# Patient Record
Sex: Male | Born: 1960 | Race: White | Hispanic: No | Marital: Married | Smoking: Never smoker
Health system: Southern US, Community
[De-identification: ages and names within clinical notes are randomized; demographics above are authoritative.]

## PROBLEM LIST (undated history)

## (undated) DIAGNOSIS — E119 Type 2 diabetes mellitus without complications: Secondary | ICD-10-CM

## (undated) DIAGNOSIS — G4730 Sleep apnea, unspecified: Secondary | ICD-10-CM

## (undated) DIAGNOSIS — IMO0001 Reserved for inherently not codable concepts without codable children: Secondary | ICD-10-CM

## (undated) HISTORY — DX: Type 2 diabetes mellitus without complications: E11.9

## (undated) HISTORY — DX: Sleep apnea, unspecified: G47.30

## (undated) HISTORY — PX: TONSILLECTOMY: SUR1361

---

## 2015-03-08 ENCOUNTER — Encounter: Payer: Self-pay | Admitting: Emergency Medicine

## 2015-03-08 ENCOUNTER — Emergency Department
Admission: EM | Admit: 2015-03-08 | Discharge: 2015-03-08 | Discharge status: Against Medical Advice | Payer: 59 | Attending: Emergency Medicine | Admitting: Emergency Medicine

## 2015-03-08 ENCOUNTER — Emergency Department: Payer: 59

## 2015-03-08 DIAGNOSIS — R079 Chest pain, unspecified: Secondary | ICD-10-CM | POA: Diagnosis present

## 2015-03-08 DIAGNOSIS — I200 Unstable angina: Secondary | ICD-10-CM | POA: Diagnosis not present

## 2015-03-08 LAB — BASIC METABOLIC PANEL
Anion gap: 5 (ref 5–15)
BUN: 15 mg/dL (ref 6–20)
CHLORIDE: 105 mmol/L (ref 101–111)
CO2: 26 mmol/L (ref 22–32)
CREATININE: 1.02 mg/dL (ref 0.61–1.24)
Calcium: 8.9 mg/dL (ref 8.9–10.3)
GFR calc Af Amer: >60 mL/min (ref >60)
GFR calc non Af Amer: >60 mL/min (ref >60)
Glucose, Bld: 183 mg/dL — ABNORMAL HIGH (ref 65–99)
POTASSIUM: 3.7 mmol/L (ref 3.5–5.1)
Sodium: 136 mmol/L (ref 135–145)

## 2015-03-08 LAB — CBC
HEMATOCRIT: 41.1 % (ref 40.0–52.0)
Hemoglobin: 13.7 g/dL (ref 13.0–18.0)
MCH: 27.5 pg (ref 26.0–34.0)
MCHC: 33.3 g/dL (ref 32.0–36.0)
MCV: 82.4 fL (ref 80.0–100.0)
PLATELETS: 198 10*3/uL (ref 150–440)
RBC: 4.99 MIL/uL (ref 4.40–5.90)
RDW: 12.9 % (ref 11.5–14.5)
WBC: 7.3 10*3/uL (ref 3.8–10.6)

## 2015-03-08 LAB — TROPONIN I: Troponin I: <0.03 ng/mL (ref <0.031)

## 2015-03-08 LAB — GLUCOSE, CAPILLARY: Glucose-Capillary: 82 mg/dL (ref 65–99)

## 2015-03-08 MED ORDER — ASPIRIN 81 MG PO CHEW
CHEWABLE_TABLET | ORAL | Status: AC
  Administered 2015-03-08: 324 mg via ORAL
  Filled 2015-03-08: qty 4

## 2015-03-08 MED ORDER — ASPIRIN 81 MG PO CHEW
324.0000 mg | CHEWABLE_TABLET | Freq: Once | ORAL | Status: AC
  Administered 2015-03-08: 324 mg via ORAL

## 2015-03-08 NOTE — ED Provider Notes (Addendum)
[facility] Emergency Department Provider Note  ____________________________________________  Time seen: Approximately 6:22 PM  I have reviewed the triage vital signs and the nursing notes.   HISTORY  Chief Complaint Chest Pain and Shortness of Breath    HPI Sharlynn OliphantDavy Kuck is a 55 y.o. male who reports chest pain and shortness of breath with exertion at work this week. Today he developed some chest pain with shortness of breath and left arm feeling funny without any exertion today. His blood pressure was elevated today. He does not usually have anything like this going on. He has not exerted himself today so he cannot say if exertion makes it worse. He's been resting pretty much the whole time. He still having some chest discomfort now although it's very mild at the present time. As some tightness and heaviness.   History reviewed. No pertinent past medical history. Patient denies any past medical history There are no active problems to display for this patient.   No past surgical history on file.  No current outpatient prescriptions on file.  Allergies Review of patient's allergies indicates no known allergies.  No family history on file.  Patient reports his father had angina in his 5840s and a CABG at age 54.  Social History Social History  Substance Use Topics  . Smoking status: Never Smoker   . Smokeless tobacco: Never Used  . Alcohol Use: Yes     Comment: occasional    Review of Systems Constitutional: No fever/chills Eyes: No visual changes. ENT: No sore throat. Cardiovascular: See history of present illness Respiratory: See history of present illness. Gastrointestinal: No abdominal pain.  No nausea, no vomiting.  No diarrhea.  No constipation. Genitourinary: Negative for dysuria. Musculoskeletal: Negative for back pain. Skin: Negative for rash. Neurological: Negative for headaches, focal weakness or numbness.  10-point ROS otherwise  negative.  ____________________________________________   PHYSICAL EXAM:  VITAL SIGNS: ED Triage Vitals  Enc Vitals Group     BP 03/08/15 1517 153/87 mmHg     Pulse Rate 03/08/15 1517 72     Resp 03/08/15 1517 16     Temp 03/08/15 1517 98.2 F (36.8 C)     Temp Source 03/08/15 1517 Oral     SpO2 03/08/15 1517 96 %     Weight 03/08/15 1517 260 lb (117.935 kg)     Height 03/08/15 1517 6\' 2"  (1.88 m)     Head Cir --      Peak Flow --      Pain Score 03/08/15 1519 2     Pain Loc --      Pain Edu? --      Excl. in GC? --     Constitutional: Alert and oriented. Well appearing and in no acute distress. Eyes: Conjunctivae are normal. PERRL. EOMI. Head: Atraumatic. Nose: No congestion/rhinnorhea. Mouth/Throat: Mucous membranes are moist.  Oropharynx non-erythematous. Neck: No stridor.  Cardiovascular: Normal rate, regular rhythm. Grossly normal heart sounds.  Good peripheral circulation. Respiratory: Normal respiratory effort.  No retractions. Lungs CTAB. Gastrointestinal: Soft and nontender. No distention. No abdominal bruits. No CVA tenderness. Musculoskeletal: No lower extremity tenderness nor edema.  No joint effusions. Neurologic:  Normal speech and language. No gross focal neurologic deficits are appreciated. No gait instability. Skin:  Skin is warm, dry and intact. No rash noted. Psychiatric: Mood and affect are normal. Speech and behavior are normal.  ____________________________________________   LABS (all labs ordered are listed, but only abnormal results are displayed)  Labs  Reviewed  BASIC METABOLIC PANEL - Abnormal; Notable for the following:    Glucose, Bld 183 (*)    All other components within normal limits  CBC  TROPONIN I  GLUCOSE, CAPILLARY   ____________________________________________  EKG  EKG read and interpreted by me shows normal sinus rhythm rate of 68 normal axis no acute ST-T wave  changes ____________________________________________  RADIOLOGY  Radiologist reads chest x-ray as no active cardiopulmonary disease ____________________________________________   PROCEDURES    ____________________________________________   INITIAL IMPRESSION / ASSESSMENT AND PLAN / ED COURSE  Pertinent labs & imaging results that were available during my care of the patient were reviewed by me and considered in my medical decision making (see chart for details).  I will ask the hospital doctor to admit the patient as he seems like this is crescendo angina and we are having a winter storm with large amounts of snow and freezing cold weather which would be a dangerous for the patient to exert himself and indeed make it difficult for him to get back here if he ran into further problems. ____________________________________________   FINAL CLINICAL IMPRESSION(S) / ED DIAGNOSES  Final diagnoses:  Crescendo angina (HCC)      Arnaldo Natal, MD 03/08/15 2129 Patient refuses to stay in the hospital. I warned him that he could possibly die before he got back if this really is crescendo angina he says he understands he is going to take the risk he will not exert himself he will follow-up with Dr. Ann Maki shows on Monday morning will take aspirin every day and again refuses to stay in the hospital knowing that it's against my medical advice.  Arnaldo Natal, MD 03/08/15 2130

## 2015-03-08 NOTE — ED Notes (Signed)
Pt stated he felt like his blood sugar was dropping. CBG was 82. Pt stated he had not eaten since around 12pm.

## 2015-03-08 NOTE — Discharge Instructions (Signed)
Angina Pectoris  Angina pectoris, often called angina, is extreme discomfort in the chest, neck, or arm. This is caused by a lack of blood in the middle and thickest layer of the heart wall (myocardium). There are four types of angina:  · Stable angina. Stable angina usually occurs in episodes of predictable frequency and duration. It is usually brought on by physical activity, stress, or excitement. Stable angina usually lasts a few minutes and can often be relieved by a medicine that you place under your tongue. This medicine is called sublingual nitroglycerin.  · Unstable angina. Unstable angina can occur even when you are doing little or no physical activity. It can even occur while you are sleeping or when you are at rest. It can suddenly increase in severity or frequency. It may not be relieved by sublingual nitroglycerin, and it can last up to 30 minutes.  · Microvascular angina. This type of angina is caused by a disorder of tiny blood vessels called arterioles. Microvascular angina is more common in women. The pain may be more severe and last longer than other types of angina pectoris.  · Prinzmetal or variant angina. This type of angina pectoris is rare and usually occurs when you are doing little or no physical activity. It especially occurs in the early morning hours.  CAUSES  Atherosclerosis is the cause of angina. This is the buildup of fat and cholesterol (plaque) on the inside of the arteries. Over time, the plaque may narrow or block the artery, and this will lessen blood flow to the heart. Plaque can also become weak and break off within a coronary artery to form a clot and cause a sudden blockage.  RISK FACTORS  Risk factors common to both men and women include:  · High cholesterol levels.  · High blood pressure (hypertension).  · Tobacco use.  · Diabetes.  · Family history of angina.  · Obesity.  · Lack of exercise.  · A diet high in saturated fats.  Women are at greater risk for angina if they  are:  · Over age 55.  · Postmenopausal.  SYMPTOMS  Many people do not experience any symptoms during the early stages of angina. As the condition progresses, symptoms common to both men and women may include:  · Chest pain.    The pain can be described as a crushing or squeezing in the chest, or a tightness, pressure, fullness, or heaviness in the chest.    The pain can last more than a few minutes, or it can stop and recur.  · Pain in the arms, neck, jaw, or back.  · Unexplained heartburn or indigestion.  · Shortness of breath.  · Nausea.  · Sudden cold sweats.  · Sudden light-headedness.  Many women have chest discomfort and some of the other symptoms. However, women often have different (atypical) symptoms, such as:   · Fatigue.  · Unexplained feelings of nervousness or anxiety.  · Unexplained weakness.  · Dizziness or fainting.  Sometimes, women may have angina without any symptoms.  DIAGNOSIS   Tests to diagnose angina may include:  · ECG (electrocardiogram).  · Exercise stress test. This looks for signs of blockage when the heart is being exercised.  · Pharmacologic stress test. This test looks for signs of blockage when the heart is being stressed with a medicine.  · Blood tests.  · Coronary angiogram. This is a procedure to look at the coronary arteries to see if there is any blockage.    surgery. This will allow your blood to pass the blockage (bypass) to reach your heart. HOME CARE INSTRUCTIONS   Take medicines only as directed by your health care provider.  Do not take the following medicines unless your health care provider approves:  Nonsteroidal anti-inflammatory drugs (NSAIDs), such as ibuprofen,  naproxen, or celecoxib.  Vitamin supplements that contain vitamin A, vitamin E, or both.  Hormone replacement therapy that contains estrogen with or without progestin.  Manage other health conditions such as hypertension and diabetes as directed by your health care provider.  Follow a heart-healthy diet. A dietitian can help to educate you about healthy food options and changes.  Use healthy cooking methods such as roasting, grilling, broiling, baking, poaching, steaming, or stir-frying. Talk to a dietitian to learn more about healthy cooking methods.  Follow an exercise program approved by your health care provider.  Maintain a healthy weight. Lose weight as approved by your health care provider.  Plan rest periods when fatigued.  Learn to manage stress.  Do not use any tobacco products, including cigarettes, chewing tobacco, or electronic cigarettes. If you need help quitting, ask your health care provider.  If you drink alcohol, and your health care provider approves, limit your alcohol intake to no more than 1 drink per day. One drink equals 12 ounces of beer, 5 ounces of wine, or 1 ounces of hard liquor.  Stop illegal drug use.  Keep all follow-up visits as directed by your health care provider. This is important. SEEK IMMEDIATE MEDICAL CARE IF:   You have pain in your chest, neck, arm, jaw, stomach, or back that lasts more than a few minutes, is recurring, or is unrelieved by taking sublingualnitroglycerin.  You have profuse sweating without cause.  You have unexplained:  Heartburn or indigestion.  Shortness of breath or difficulty breathing.  Nausea or vomiting.  Fatigue.  Feelings of nervousness or anxiety.  Weakness.  Diarrhea.  You have sudden light-headedness or dizziness.  You faint. These symptoms may represent a serious problem that is an emergency. Do not wait to see if the symptoms will go away. Get medical help right away. Call your local  emergency services (911 in the [place] Do not drive yourself to the hospital.   This information is not intended to replace advice given to you by your health care provider. Make sure you discuss any questions you have with your health care provider.   Document Released: 02/16/2005 Document Revised: 03/09/2014 Document Reviewed: 06/20/2013 Elsevier Interactive Patient Education [employer]2016 [employer].  Please take one regular aspirin or 4 baby aspirins every day. You had the dose today in the emergency room. Please call Dr. Darrold JunkerPARASCHOS, the cardiologist, Monday morning. Tell his office staff that she was in the emergency room with chest pain and the doctor was worried about crescendo angina but she went home. Tell them that you need follow-up in the ER doctor said you could probably get seen on Monday morning. Please be sure to call the ambulance and return if you have further chest tightness or heaviness sweating and shortness of breath or other complaints. Do not do any exercising this weekend.

## 2015-03-08 NOTE — ED Notes (Signed)
Pt refused to stay, signed out AMA.  Pt given follow up information.  Verbalized understanding.  No questions or concerns at this time.  Teach back verified.  Pt in NAD.  No items left in ED.

## 2015-03-08 NOTE — ED Notes (Signed)
C/o shortness of breath with exertion x 1 week.  Today around lunch patient describes feeling "jittery" and feeling left chest tightness and left arm aching.  Also felt a "little more out of breath with the jittery feeling".

## 2015-05-29 ENCOUNTER — Inpatient Hospital Stay: Admission: RE | Admit: 2015-05-29 | Payer: 59 | Source: Ambulatory Visit

## 2015-05-30 ENCOUNTER — Inpatient Hospital Stay: Admission: RE | Admit: 2015-05-30 | Payer: 59 | Source: Ambulatory Visit

## 2015-05-30 ENCOUNTER — Encounter: Payer: Self-pay | Admitting: *Deleted

## 2015-05-30 ENCOUNTER — Other Ambulatory Visit: Payer: 59

## 2015-05-30 NOTE — Patient Instructions (Signed)
  Your procedure is scheduled on: 05-31-15 Report to [facility] 2ND FLOOR To find out your arrival time please call [phone] between 1PM - 3PM on 05-29-15  Remember: Instructions that are not followed completely may result in serious medical risk, up to and including death, or upon the discretion of your surgeon and anesthesiologist your surgery may need to be rescheduled.    _X___ 1. Do not eat food or drink liquids after midnight. No gum chewing or hard candies.     _X___ 2. No Alcohol for 24 hours before or after surgery.   ____ 3. Bring all medications with you on the day of surgery if instructed.    ____ 4. Notify your doctor if there is any change in your medical condition     (cold, fever, infections).     Do not wear jewelry, make-up, hairpins, clips or nail polish.  Do not wear lotions, powders, or perfumes. You may wear deodorant.  Do not shave 48 hours prior to surgery. Men may shave face and neck.  Do not bring valuables to the hospital.    [facility] is not responsible for any belongings or valuables.               Contacts, dentures or bridgework may not be worn into surgery.  Leave your suitcase in the car. After surgery it may be brought to your room.  For patients admitted to the hospital, discharge time is determined by your treatment team.   Patients discharged the day of surgery will not be allowed to drive home.   Please read over the following fact sheets that you were given:      ____ Take these medicines the morning of surgery with A SIP OF WATER:    1. MAY TAKE HYDROCODONE IF NEEDED DAY OF SURGERY WITH A SMALL SIP OF WATER  2.   3.   4.  5.  6.  ____ Fleet Enema (as directed)   ____ Use CHG Soap as directed  ____ Use inhalers on the day of surgery  ____ Stop metformin 2 days prior to surgery    ____ Take 1/2 of usual insulin dose the night before surgery and none on the morning of surgery.   ____ Stop  Coumadin/Plavix/aspirin-N/A  ____ Stop Anti-inflammatories-NO NSAIDS OR ASA PRODUCTS-HYDROCODONE OK TO CONTINUE   ____ Stop supplements until after surgery.    ____ Bring C-Pap to the hospital.

## 2015-05-30 NOTE — Pre-Procedure Instructions (Signed)
Verneda Skill, RT - 05/22/2015 2:00 PM EDT  Mertie Moores, MD - 05/22/2015 2:00 PM EDT Formatting of this note may be different from the original.  Referring Physician:  Barbette Merino, *  Pulmonologist: @  Sleep Apnea  History of Present Illness: Andre Christensen is a 55 y.o. male that presents to clinic for . He snores, he wakes up tired " all the time." There is rarely am headaches. No falling asleep with driving. He has had a sleep study greater than 10 years ago. Told he had mild apnea. Cpap was not offered. He having difficulty with ED, energy, labile blood pressure. No diabetes, b/o is controlled. No chest pain, leg edema, syncope or ectopy. No worsening shortness of breath, coughing, hemoptysis, gerd ot fever. Aware he need to lose weight.    Current Medications:  Current Outpatient Prescriptions  Medication Sig Dispense Refill  . aspirin 325 MG EC tablet Take 325 mg by mouth once daily. Reported on 03/26/2015   No current facility-administered medications for this visit.   Problem List:  Patient Active Problem List  Diagnosis  . Acute chest wall pain  . SOB (shortness of breath)  . Arm pain, left   Allergies:  Review of patient's allergies indicates no known allergies.  History:  Past Medical History  Diagnosis Date  . Allergic rhinitis due to allergen  . Obesity  . Sleep apnea   History reviewed. No pertinent past surgical history.  Family History  Problem Relation Age of Onset  . Coronary artery disease Father  . Diabetes type II Father  . Hypertension Father   reports that he has never smoked. He has never used smokeless tobacco. He reports that he drinks alcohol. He reports that he does not use illicit drugs.  Review of System: As per above. All systems were reviewed with him in totality. No other cardiopulmonary symptoms. No nausea, vomiting, diarrhea, blood in stools,dysuria or flank pain. No GERD, dysphagia, choking spells,  polyphagia, polydipsia, heat or cold intolerance, rashes, lymph nodes, bleeding, seizures, TIAs, passing out spells, suicidaly ideation, proximal muscle tenderness, joint effusions.   Physical Exam: Visit Vitals  . BP 138/81  . Pulse 77  . Temp 36.7 C (98.1 F) (Oral)  . SpO2 96%   96% ON RA General: NAD. Able to speak in complete sentences without cough or dyspnea HEENT: Normocephalic, nontraumatic. Extraocular movements intact NECK: Supple. No JVD, nodes, thryomegaly CV: RRR no murmurs, gallops, rubs PULM: Normal respiratory effort, Clear to auscultation bilaterally without wheezing or crackles EXTREMITIESs: No significant edema, cyanosis or Homans'signs SKIN: Fair turgor. No rashes LYMPHATIC: No nodes NEURO: No gross deficits PSYCH: Appropriate affect   Diagnostics:  SPIROMETRY: FVC was 4.01 liters, 78% of predicted/Post 3.96, 77%, -1% Change FEV1 was 3.07, 74% of predicted/Post 3.13, 75%, 2% Change FEV1 ratio was 76/Post 79 FEF 25-75% liters per second was 62% of predicted/Post 73% 19% Change *SVN given with 2.5 mg Albuterol for Post Spirometry  LUNG VOLUMES: TLC was 75% of predicted RV was 62% of predicted  DIFFUSION CAPACITY: DLCO was 80% of predicted DLCO/VA was 110% of predicted  FLOW VOLUME LOOP: normal  Impression Spirometry should mix ventilatory defect, no bronchodilator effect TLC is mildly decreased DLCO in low normal range  CLINICAL DATA:Patient with chest tightness.Dry mouth.  EXAM: CHEST2 VIEW  COMPARISON:None.  FINDINGS: Normal cardiac and mediastinal contours. No consolidative pulmonary opacities. No pleural effusion or pneumothorax. Regional skeleton is unremarkable.  IMPRESSION: No active cardiopulmonary disease.  Electronically Signed  ByAuburn Bilberry M.D. On: 03/08/2015 15:59  Impression: History is c/w obstructive sleep apnea. I am invoking that his mild apnea prior has gotten worse with the weight gain. He is  symptomatic  Mild obstruction on spirometry There is also restriction most likely due to his weight.  Mild rhinitis, mild cough  PLAN: - Sleep study - weight loss - avoid sedation - prn claritin - follow one week After the sleep study - will check TSH, testosterone levels today   Back to top of Progress Notes Verneda Skill, RT - 05/22/2015 2:00 PM EDT Procedure Complete Pulmonary Function Test  Reason for PFT SOB/Cough in Non Smoker Second Hand Smoke from Father Occupational [employer]-Exposed to Silicosis Known Allergies (Environmental) Allergy Shots 25 years ago  Findings Jermarcus Mcfadyen is a 55 y.o. male reports that he has never smoked. He has never used smokeless tobacco.  SPIROMETRY: FVC was 4.01 liters, 78% of predicted/Post 3.96, 77%, -1% Change FEV1 was 3.07, 74% of predicted/Post 3.13, 75%, 2% Change FEV1 ratio was 76/Post 79 FEF 25-75% liters per second was 62% of predicted/Post 73% 19% Change *SVN given with 2.5 mg Albuterol for Post Spirometry  LUNG VOLUMES: TLC was 75% of predicted RV was 62% of predicted  DIFFUSION CAPACITY: DLCO was 80% of predicted DLCO/VA was 110% of predicted  FLOW VOLUME LOOP: normal  Impression Spirometry should mix ventilatory defect, no bronchodilator effect TLC is mildly decreased DLCO in low normal range   Back to top of Progress Notes Plan of Treatment - as of this encounter Upcoming Encounters Upcoming Encounters  Date Type Specialty Care Team Description  06/12/2015 Office Visit Pulmonology Mertie Moores, MD  [address]  [facility] - PULMONOLOGY  [place], [place] [address]  [phone]  [phone] (Fax)    09/23/2015 Office Visit Cardiology Darrold Junker, Lyn Hollingshead, MD  [address]  [facility]  [place], [place] [address]  [phone]  [phone] (Fax)    Lab Results - in this encounter Table of Contents for Lab Results Testosterone,  Total, LC/MS - [facility] (05/22/2015 3:51 PM) Thyroid Stimulating-Hormone (TSH) (05/22/2015 3:51 PM)    Testosterone, Total, LC/MS - [facility] (05/22/2015 3:51 PM) Testosterone, Total, LC/MS - [facility] (05/22/2015 3:51 PM)  Component Value Ref Range  Testosterone, Total, LC/MS - [facility] 216.4 (L) Comment:  This test was developed and its performance characteristics determined by [facility]. It has not been cleared or approved by the Food and Drug Administration.  348.0 - 1197.0 ng/dL  Comment: - [facility] Comment Comment:  Adult male reference interval is based on a population of lean males up to 54 years old.     Testosterone, Total, LC/MS - [facility] (05/22/2015 3:51 PM)  Specimen Performing Laboratory  Blood [facility]    Testosterone, Total, LC/MS - [facility] (05/22/2015 3:51 PM)  Narrative  Performed at:01 - [facility]  [address] [place], [id number]  Lab Director: Mila Homer MD, Phone:[phone]   Back to top of Lab Results   Thyroid Stimulating-Hormone (TSH) (05/22/2015 3:51 PM) Thyroid Stimulating-Hormone (TSH) (05/22/2015 3:51 PM)  Component Value Ref Range  Thyroid Stimulating Hormone (TSH) 0.999 0.340 - 5.600 uIU/mL   Thyroid Stimulating-Hormone (TSH) (05/22/2015 3:51 PM)  Specimen Performing Laboratory  Blood [facility] - LAB  [address]  [place], [place] [address]   Back to top of Lab Results Miscellaneous Results - in this encounter   PULMONARY FUNCTION TEST (05/22/2015) PULMONARY FUNCTION TEST (05/22/2015)  Narrative  Ordered by an unspecified  provider.   Visit Diagnoses - in this encounter Diagnosis  SOB (shortness of breath) - Primary  Shortness of breath   Chronic fatigue  Other malaise and fatigue   Obstructive sleep apnea (adult)   Obstructive sleep apnea (adult) (pediatric)   Chronic rhinitis  Orders - in this encounter PFT Count Last Ordered Date First Ordered Date  CARBON MONOXIDE  DIFFUSING CAPACITY  05/22/2015   LUNG VOLUMES  05/22/2015   SPIROMETRY WITH BRONCHODILATOR  05/22/2015   Document Information Service Providers Document Coverage Dates Mar. 22, 2017 - Mar. 23, 2017 Custodian Organization [facility] [phone] (Work) [place], [place] [id number] Encounter Providers Mertie MooresHerbon E Fleming MD (Attending) [phone] (Work) [phone] (Fax)  [address] [facility] [place], [place] [id number]

## 2015-05-30 NOTE — Pre-Procedure Instructions (Signed)
Andre Millard, MD - 03/26/2015 3:30 PM EST Formatting of this note may be different from the original. Established Patient Visit   Chief Complaint: Chief Complaint  Patient presents with  . Follow-up  2 week- ETT  Date of Service: 03/26/2015 Date of Birth: [birth year] PCP: DAVID Cyril Loosen, MD  History of Present Illness: Andre Christensen is a 55 y.o.male patient who returns for evaluation chest pain. Patient was seen at [facility] emergency room 03/08/2015 with chest discomfort at which time ECG revealed normal sinus rhythm. Since his initial visit on 03/12/2015, the patient has had mild recurrence of chest tightness. He underwent ETT on 03/12/2015, exercised 10 minutes on a Bruce protocol without chest pain. There were no ischemic ECG changes observed. The patient denies shortness of breath. He is not experiencing palpitations or heart racing. He denies peripheral edema. The patient typically is active but does not do any structured exercise.  Past Medical and Surgical History  Past Medical History History reviewed. No pertinent past medical history.  Past Surgical History He has no past surgical history on file.   Medications and Allergies  Current Medications  Current Outpatient Prescriptions  Medication Sig Dispense Refill  . aspirin 325 MG EC tablet Take 325 mg by mouth once daily. Reported on 03/26/2015   No current facility-administered medications for this visit.   Allergies: Review of patient's allergies indicates no known allergies.  Social and Family History  Social History reports that he has never smoked. He does not have any smokeless tobacco history on file. He reports that he drinks alcohol. He reports that he does not use illicit drugs.  Family History Family History  Problem Relation Age of Onset  . Coronary artery disease Father  . Diabetes type II Father  . Hypertension Father   Review of Systems   Review of Systems: The patient reports mild chest pain,  without shortness of breath, orthopnea, paroxysmal nocturnal dyspnea, pedal edema, palpitations, heart racing, presyncope, syncope. Review of 12 Systems is negative except as described above.  Physical Examination   Vitals: Visit Vitals  . BP 150/78  . Pulse 80  . Ht 188 cm ( )  . Wt (!) 117 kg (258 lb)  . BMI 33.13 kg/m2   Ht:188 cm ( ) Wt:(!) 117 kg (258 lb) XBJ:YNWG surface area is 2.47 meters squared. Body mass index is 33.13 kg/(m^2).  HEENT: Pupils equally reactive to light and accomodation  Neck: Supple without thyromegaly, carotid pulses 2+ Lungs: clear to auscultation bilaterally; no wheezes, rales, rhonchi Heart: Regular rate and rhythm. No gallops, murmurs or rub Abdomen: soft nontender, nondistended, with normal bowel sounds Extremities: no cyanosis, clubbing, or edema Peripheral Pulses: 2+ in all extremities, 2+ femoral pulses bilaterally  Assessment   55 y.o. male with  1. Acute chest wall pain  2. Arm pain, left  3. SOB (shortness of breath)   55 year old gentleman with 1 month history of intermittent episodes of exertional chest discomfort and shortness of breath, with negative ETT. Patient has minimal cardiovascular risk factors.  Plan   1. Continue current medications 2. Counseled patient about low-sodium diet 3. DASH diet printed instructions given to the patient 4. Encourage patient exercise regularly 5. Encourage patient lose weight 6. Defer cardiac catheterization at this time 7. Return to clinic for follow-up in 6 months  No orders of the defined types were placed in this encounter.  Return in about 6 months (around 09/23/2015).  Andre Millard, MD    Plan of Treatment -  as of this encounter Upcoming Encounters Upcoming Encounters  Date Type Specialty Care Team Description  06/12/2015 Office Visit Pulmonology Mertie MooresFleming, Herbon E, MD  [address]  [facility]  [place], [place] [address]   [phone]  [phone] (Fax)    09/23/2015 Office Visit Cardiology Darrold JunkerParaschos, Lyn HollingsheadAlexander, MD  [address]  [facility]  [place], [place] [address]  [phone]  [phone] (Fax)    Visit Diagnoses - in this encounter Diagnosis  Acute chest wall pain - Primary  Arm pain, left  Pain in soft tissues of limb   SOB (shortness of breath)  Shortness of breath   Document Information Service Providers Document Coverage Dates Jan. 24, 2017 - Jan. 24, 2017 Custodian Organization [facility] [phone] (Work) [place], [place] [address] Encounter Providers Andre MillardAlexander Paraschos MD (Attending) [phone] (Work) [phone] (Fax)  [address] [facility] [place], [place] [address]

## 2015-05-30 NOTE — Pre-Procedure Instructions (Signed)
Andre Christensen, Leslie, RT - 05/22/2015 2:00 PM EDT Procedure Complete Pulmonary Function Test  Reason for PFT SOB/Cough in Non Smoker Second Hand Smoke from Father Occupational [employer]-Exposed to Silicosis Known Allergies (Environmental) Allergy Shots 25 years ago  Findings Andre Christensen is a 55 y.o. male reports that he has never smoked. He has never used smokeless tobacco.  SPIROMETRY: FVC was 4.01 liters, 78% of predicted/Post 3.96, 77%, -1% Change FEV1 was 3.07, 74% of predicted/Post 3.13, 75%, 2% Change FEV1 ratio was 76/Post 79 FEF 25-75% liters per second was 62% of predicted/Post 73% 19% Change *SVN given with 2.5 mg Albuterol for Post Spirometry  LUNG VOLUMES: TLC was 75% of predicted RV was 62% of predicted  DIFFUSION CAPACITY: DLCO was 80% of predicted DLCO/VA was 110% of predicted  FLOW VOLUME LOOP: normal  Impression Spirometry should mix ventilatory defect, no bronchodilator effect TLC is mildly decreased DLCO in low normal range

## 2015-05-31 ENCOUNTER — Ambulatory Visit
Admission: RE | Admit: 2015-05-31 | Discharge: 2015-05-31 | Disposition: A | Discharge status: Home / Self Care | Payer: 59 | Source: Ambulatory Visit | Attending: Podiatry | Admitting: Podiatry

## 2015-05-31 ENCOUNTER — Ambulatory Visit: Payer: 59 | Admitting: Anesthesiology

## 2015-05-31 ENCOUNTER — Ambulatory Visit: Payer: 59

## 2015-05-31 ENCOUNTER — Encounter
Admission: RE | Disposition: A | Discharge status: Home / Self Care | Payer: Self-pay | Source: Ambulatory Visit | Attending: Podiatry

## 2015-05-31 DIAGNOSIS — Z7982 Long term (current) use of aspirin: Secondary | ICD-10-CM | POA: Diagnosis not present

## 2015-05-31 DIAGNOSIS — E669 Obesity, unspecified: Secondary | ICD-10-CM | POA: Insufficient documentation

## 2015-05-31 DIAGNOSIS — S92002A Unspecified fracture of left calcaneus, initial encounter for closed fracture: Secondary | ICD-10-CM | POA: Insufficient documentation

## 2015-05-31 DIAGNOSIS — Z79899 Other long term (current) drug therapy: Secondary | ICD-10-CM | POA: Insufficient documentation

## 2015-05-31 DIAGNOSIS — K219 Gastro-esophageal reflux disease without esophagitis: Secondary | ICD-10-CM | POA: Diagnosis not present

## 2015-05-31 DIAGNOSIS — G4730 Sleep apnea, unspecified: Secondary | ICD-10-CM | POA: Diagnosis not present

## 2015-05-31 HISTORY — DX: Reserved for inherently not codable concepts without codable children: IMO0001

## 2015-05-31 HISTORY — PX: ORIF CALCANEOUS FRACTURE: SHX5030

## 2015-05-31 SURGERY — OPEN REDUCTION INTERNAL FIXATION (ORIF) CALCANEOUS FRACTURE
Anesthesia: General | Laterality: Left | Wound class: Clean

## 2015-05-31 MED ORDER — BUPIVACAINE HCL (PF) 0.25 % IJ SOLN
INTRAMUSCULAR | Status: AC
  Filled 2015-05-31: qty 30

## 2015-05-31 MED ORDER — FENTANYL CITRATE (PF) 100 MCG/2ML IJ SOLN
INTRAMUSCULAR | Status: DC | PRN
  Administered 2015-05-31 (×2): 50 ug via INTRAVENOUS

## 2015-05-31 MED ORDER — ROPIVACAINE HCL 5 MG/ML IJ SOLN
INTRAMUSCULAR | Status: AC
  Administered 2015-05-31: 15 mL via PERINEURAL
  Administered 2015-05-31: 10 mL via EPIDURAL
  Administered 2015-05-31: 10 mL via PERINEURAL
  Administered 2015-05-31: 5 mL via PERINEURAL
  Filled 2015-05-31: qty 40

## 2015-05-31 MED ORDER — FENTANYL CITRATE (PF) 100 MCG/2ML IJ SOLN: 25.0000 ug | INTRAMUSCULAR | Status: DC | PRN

## 2015-05-31 MED ORDER — CEFAZOLIN SODIUM-DEXTROSE 2-4 GM/100ML-% IV SOLN
INTRAVENOUS | Status: AC
  Filled 2015-05-31: qty 100

## 2015-05-31 MED ORDER — LACTATED RINGERS IV SOLN
INTRAVENOUS | Status: DC
  Administered 2015-05-31: 08:00:00 via INTRAVENOUS

## 2015-05-31 MED ORDER — ROCURONIUM 10MG/ML (10ML) SYRINGE FOR MEDFUSION PUMP - OPTIME
INTRAVENOUS | Status: DC | PRN
  Administered 2015-05-31: 20 mg via INTRAVENOUS
  Administered 2015-05-31: 10 mg via INTRAVENOUS

## 2015-05-31 MED ORDER — CEFAZOLIN SODIUM-DEXTROSE 2-4 GM/100ML-% IV SOLN
2.0000 g | Freq: Once | INTRAVENOUS | Status: AC
  Administered 2015-05-31: 2 g via INTRAVENOUS

## 2015-05-31 MED ORDER — ONDANSETRON HCL 4 MG PO TABS: 4.0000 mg | ORAL_TABLET | Freq: Four times a day (QID) | ORAL | Status: DC | PRN

## 2015-05-31 MED ORDER — ONDANSETRON HCL 4 MG/2ML IJ SOLN
INTRAMUSCULAR | Status: DC | PRN
  Administered 2015-05-31: 4 mg via INTRAVENOUS

## 2015-05-31 MED ORDER — BUPIVACAINE HCL 0.25 % IJ SOLN
INTRAMUSCULAR | Status: DC | PRN
  Administered 2015-05-31: 10 mL

## 2015-05-31 MED ORDER — OXYCODONE HCL 5 MG PO TABS: 5.0000 mg | ORAL_TABLET | Freq: Once | ORAL | Status: DC | PRN

## 2015-05-31 MED ORDER — LIDOCAINE HCL (PF) 1 % IJ SOLN
INTRAMUSCULAR | Status: AC
  Filled 2015-05-31: qty 30

## 2015-05-31 MED ORDER — SUCCINYLCHOLINE CHLORIDE 20 MG/ML IJ SOLN
INTRAMUSCULAR | Status: DC | PRN
  Administered 2015-05-31: 110 mg via INTRAVENOUS

## 2015-05-31 MED ORDER — FENTANYL CITRATE (PF) 100 MCG/2ML IJ SOLN
INTRAMUSCULAR | Status: AC
  Administered 2015-05-31: 50 ug via INTRAVENOUS
  Filled 2015-05-31: qty 2

## 2015-05-31 MED ORDER — FAMOTIDINE 20 MG PO TABS
ORAL_TABLET | ORAL | Status: AC
  Administered 2015-05-31: 20 mg via ORAL
  Filled 2015-05-31: qty 1

## 2015-05-31 MED ORDER — MIDAZOLAM HCL 2 MG/2ML IJ SOLN
INTRAMUSCULAR | Status: DC | PRN
  Administered 2015-05-31: 2 mg via INTRAVENOUS

## 2015-05-31 MED ORDER — OXYCODONE HCL 5 MG/5ML PO SOLN: 5.0000 mg | Freq: Once | ORAL | Status: DC | PRN

## 2015-05-31 MED ORDER — PROPOFOL 10 MG/ML IV BOLUS
INTRAVENOUS | Status: DC | PRN
  Administered 2015-05-31: 200 mg via INTRAVENOUS

## 2015-05-31 MED ORDER — LIDOCAINE HCL (CARDIAC) 20 MG/ML IV SOLN
INTRAVENOUS | Status: DC | PRN
  Administered 2015-05-31 (×2): 100 mg via INTRAVENOUS

## 2015-05-31 MED ORDER — MIDAZOLAM HCL 5 MG/5ML IJ SOLN
1.0000 mg | Freq: Once | INTRAMUSCULAR | Status: AC
  Administered 2015-05-31: 1 mg via INTRAVENOUS

## 2015-05-31 MED ORDER — HYDROCODONE-ACETAMINOPHEN 5-325 MG PO TABS: 1.0000 | ORAL_TABLET | ORAL | Status: DC | PRN

## 2015-05-31 MED ORDER — MIDAZOLAM HCL 5 MG/5ML IJ SOLN
INTRAMUSCULAR | Status: AC
  Administered 2015-05-31: 1 mg via INTRAVENOUS
  Filled 2015-05-31: qty 5

## 2015-05-31 MED ORDER — FAMOTIDINE 20 MG PO TABS
20.0000 mg | ORAL_TABLET | Freq: Once | ORAL | Status: AC
  Administered 2015-05-31: 20 mg via ORAL

## 2015-05-31 MED ORDER — LIDOCAINE HCL (PF) 1 % IJ SOLN
INTRAMUSCULAR | Status: AC
  Administered 2015-05-31: 1 mL via INTRADERMAL
  Filled 2015-05-31: qty 5

## 2015-05-31 MED ORDER — BUPIVACAINE HCL (PF) 0.5 % IJ SOLN
INTRAMUSCULAR | Status: AC
  Filled 2015-05-31: qty 30

## 2015-05-31 MED ORDER — FENTANYL CITRATE (PF) 100 MCG/2ML IJ SOLN
50.0000 ug | Freq: Once | INTRAMUSCULAR | Status: AC
  Administered 2015-05-31: 50 ug via INTRAVENOUS
  Filled 2015-05-31: qty 1

## 2015-05-31 MED ORDER — ONDANSETRON HCL 4 MG/2ML IJ SOLN: 4.0000 mg | Freq: Four times a day (QID) | INTRAMUSCULAR | Status: DC | PRN

## 2015-05-31 MED ORDER — SUGAMMADEX SODIUM 500 MG/5ML IV SOLN
INTRAVENOUS | Status: DC | PRN
  Administered 2015-05-31: 235 mg via INTRAVENOUS

## 2015-05-31 MED ORDER — PHENYLEPHRINE HCL 10 MG/ML IJ SOLN
INTRAMUSCULAR | Status: DC | PRN
  Administered 2015-05-31: 100 ug via INTRAVENOUS

## 2015-05-31 SURGICAL SUPPLY — 46 items
BANDAGE ACE 4X5 VEL STRL LF (GAUZE/BANDAGES/DRESSINGS) ×4 IMPLANT
BANDAGE ELASTIC 4 LF NS (GAUZE/BANDAGES/DRESSINGS) ×2 IMPLANT
BANDAGE STRETCH 3X4.1 STRL (GAUZE/BANDAGES/DRESSINGS) ×4 IMPLANT
BIT DRILL CANN 3.5MM (DRILL) ×1 IMPLANT
BNDG COHESIVE 4X5 TAN STRL (GAUZE/BANDAGES/DRESSINGS) ×2 IMPLANT
BNDG ESMARK 4X12 TAN STRL LF (GAUZE/BANDAGES/DRESSINGS) ×2 IMPLANT
BNDG GAUZE 4.5X4.1 6PLY STRL (MISCELLANEOUS) ×4 IMPLANT
CANISTER SUCT 1200ML W/VALVE (MISCELLANEOUS) ×2 IMPLANT
DRAPE FLUOR MINI C-ARM 54X84 (DRAPES) ×2 IMPLANT
DRILL CANN 3.5MM (DRILL) ×2
DRSG TEGADERM 4X4.75 (GAUZE/BANDAGES/DRESSINGS) ×2 IMPLANT
DURAPREP 26ML APPLICATOR (WOUND CARE) ×2 IMPLANT
ELECT REM PT RETURN 9FT ADLT (ELECTROSURGICAL) ×2
ELECTRODE REM PT RTRN 9FT ADLT (ELECTROSURGICAL) ×1 IMPLANT
GAUZE PETRO XEROFOAM 1X8 (MISCELLANEOUS) ×2 IMPLANT
GAUZE SPONGE 4X4 12PLY STRL (GAUZE/BANDAGES/DRESSINGS) ×4 IMPLANT
GAUZE STRETCH 2X75IN STRL (MISCELLANEOUS) ×4 IMPLANT
GLOVE BIO SURGEON STRL SZ7.5 (GLOVE) ×4 IMPLANT
GLOVE INDICATOR 8.0 STRL GRN (GLOVE) ×2 IMPLANT
GOWN STRL REUS W/ TWL LRG LVL3 (GOWN DISPOSABLE) ×2 IMPLANT
GOWN STRL REUS W/TWL LRG LVL3 (GOWN DISPOSABLE) ×2
K-WIRE 1.8 (WIRE) ×1
K-WIRE FX200X1.8XTROC TIP (WIRE) ×1
KIT RM TURNOVER STRD PROC AR (KITS) ×2 IMPLANT
KWIRE FX200X1.8XTROC TIP (WIRE) ×1 IMPLANT
NDL SAFETY 25GX1.5 (NEEDLE) ×4 IMPLANT
NEEDLE FILTER BLUNT 18X 1/2SAF (NEEDLE) ×1
NEEDLE FILTER BLUNT 18X1 1/2 (NEEDLE) ×1 IMPLANT
NS IRRIG 500ML POUR BTL (IV SOLUTION) ×2 IMPLANT
PACK EXTREMITY ARMC (MISCELLANEOUS) ×2 IMPLANT
PAD ABD DERMACEA PRESS 5X9 (GAUZE/BANDAGES/DRESSINGS) ×2 IMPLANT
PENCIL ELECTRO HAND CTR (MISCELLANEOUS) ×2 IMPLANT
SCREW CANN 5.0X50MM (Screw) ×1 IMPLANT
SCREW CANN PT 50X5XCANN NS MTP (Screw) ×1 IMPLANT
SCREW CANNULATED 5.0X44MM (Screw) ×2 IMPLANT
SPLINT CAST 1 STEP 4X30 (MISCELLANEOUS) ×2 IMPLANT
SPLINT CAST 1 STEP 5X30 WHT (MISCELLANEOUS) ×2 IMPLANT
SPLINT FAST PLASTER 5X30 (CAST SUPPLIES) ×1
SPLINT PLASTER CAST FAST 5X30 (CAST SUPPLIES) ×1 IMPLANT
STOCKINETTE M/LG 89821 (MISCELLANEOUS) ×2 IMPLANT
STRIP CLOSURE SKIN 1/4X4 (GAUZE/BANDAGES/DRESSINGS) ×4 IMPLANT
SUT VIC AB 2-0 CT1 27 (SUTURE) ×1
SUT VIC AB 2-0 CT1 TAPERPNT 27 (SUTURE) ×1 IMPLANT
SUT VIC AB 4-0 FS2 27 (SUTURE) ×2 IMPLANT
SUT VICRYL AB 3-0 FS1 BRD 27IN (SUTURE) ×2 IMPLANT
SYRINGE 10CC LL (SYRINGE) ×6 IMPLANT

## 2015-05-31 NOTE — Discharge Instructions (Signed)
[facility]   POST OPERATIVE INSTRUCTIONS FOR DR. TROXLER AND DR[facility]   1. Take your medication as prescribed.  Pain medication should be taken only as needed.  2. Keep the dressing clean, dry and intact.  3. Keep your foot elevated above the heart level for the first 48 hours.  4. Walking to the bathroom and brief periods of walking are acceptable, unless we have instructed you to be non-weight bearing.  5. Always wear your post-op shoe when walking.  Always use your crutches if you are to be non-weight bearing.  6. Do not take a shower.  7. Every hour you are awake:  - Bend your knee 15 times. - Massage calf 15 times  8. Call [facility] [phone]) if any of the following problems occur: - You develop a temperature or fever. - The bandage becomes saturated with blood. - Medication does not stop your pain. - Injury of the foot occurs. - Any symptoms of infection including redness, odor, or red streaks running from wound. AMBULATORY SURGERY  DISCHARGE INSTRUCTIONS   1) The drugs that you were given will stay in your system until tomorrow so for the next 24 hours you should not:  A) Drive an automobile B) Make any legal decisions C) Drink any alcoholic beverage   2) You may resume regular meals tomorrow.  Today it is better to start with liquids and gradually work up to solid foods.  You may eat anything you prefer, but it is better to start with liquids, then soup and crackers, and gradually work up to solid foods.   3) Please notify your doctor immediately if you have any unusual bleeding, trouble breathing, redness and pain at the surgery site, drainage, fever, or pain not relieved by medication.    4) Additional Instructions:        Please contact your physician with any problems or Same Day Surgery at [phone], Monday through Friday 6 am to 4 pm, or [place] at [facility] Main number  at [phone].

## 2015-05-31 NOTE — Anesthesia Preprocedure Evaluation (Signed)
Anesthesia Evaluation  Patient identified by MRN, date of birth, ID band Patient awake    Reviewed: Allergy & Precautions, H&P , NPO status , Patient's Chart, lab work & pertinent test results  History of Anesthesia Complications Negative for: history of anesthetic complications  Airway Mallampati: II  TM Distance: >3 FB Neck ROM: full    Dental  (+) Poor Dentition   Pulmonary neg pulmonary ROS, neg shortness of breath,    Pulmonary exam normal breath sounds clear to auscultation       Cardiovascular Exercise Tolerance: Good (-) angina(-) Past MI and (-) DOE negative cardio ROS Normal cardiovascular exam Rhythm:regular Rate:Normal     Neuro/Psych negative neurological ROS  negative psych ROS   GI/Hepatic Neg liver ROS, GERD  Medicated and Controlled,  Endo/Other  negative endocrine ROS  Renal/GU negative Renal ROS  negative genitourinary   Musculoskeletal   Abdominal   Peds  Hematology negative hematology ROS (+)   Anesthesia Other Findings Past Medical History:   Shortness of breath dyspnea                                    Comment:IN JAN 2017-RECENTLY-SAW DR PARASHCOS AND DR               FLEMMINNG-NOTE ATTACHED IN EPIC FROM MD'S               (05-30-15)  Past Surgical History:   TONSILLECTOMY                                                   Comment:AGE 55  BMI    Body Mass Index   33.23 kg/m 2    Signs and symptoms suggestive of sleep apnea    Reproductive/Obstetrics negative OB ROS                             Anesthesia Physical Anesthesia Plan  ASA: III  Anesthesia Plan: General LMA   Post-op Pain Management:  Regional for Post-op pain   Induction:   Airway Management Planned:   Additional Equipment:   Intra-op Plan:   Post-operative Plan:   Informed Consent: I have reviewed the patients History and Physical, chart, labs and discussed the procedure  including the risks, benefits and alternatives for the proposed anesthesia with the patient or authorized representative who has indicated his/her understanding and acceptance.   Dental Advisory Given  Plan Discussed with: Anesthesiologist, CRNA and Surgeon  Anesthesia Plan Comments:         Anesthesia Quick Evaluation

## 2015-05-31 NOTE — Anesthesia Procedure Notes (Addendum)
Anesthesia Regional Block:  Popliteal block  Pre-Anesthetic Checklist: ,, timeout performed, Correct Patient, Correct Site, Correct Laterality, Correct Procedure, Correct Position, site marked, Risks and benefits discussed,  Surgical consent,  Pre-op evaluation,  At surgeon's request and post-op pain management  Laterality: Lower and Left  Prep: chloraprep       Needles:  Injection technique: Single-shot  Needle Type: Echogenic Needle     Needle Length: 9cm 9 cm Needle Gauge: 21 and 21 G    Additional Needles:  Procedures: ultrasound guided (picture in chart) Popliteal block Narrative:  Start time: 05/31/2015 8:30 AM End time: 05/31/2015 8:33 AM Injection made incrementally with aspirations every 5 mL.  Performed by: Personally  Anesthesiologist: Margorie JohnPISCITELLO, JOSEPH K  Additional Notes: A saphenous ring was also placed at the level of the knee.  Functioning IV was confirmed and monitors were applied.  A echogenic needle was used. Sterile prep,hand hygiene and sterile gloves were used.  Negative aspiration and negative test dose prior to incremental administration of local anesthetic. The patient tolerated the procedure well with no immediate complications.   Procedure Name: Intubation Performed by: Malva CoganBEANE, Yetunde Leis Pre-anesthesia Checklist: Patient identified, Patient being monitored, Timeout performed, Emergency Drugs available and Suction available Patient Re-evaluated:Patient Re-evaluated prior to inductionOxygen Delivery Method: Circle system utilized Preoxygenation: Pre-oxygenation with 100% oxygen Intubation Type: IV induction Ventilation: Mask ventilation without difficulty Laryngoscope Size: Mac and 3 Grade View: Grade I Tube type: Oral Tube size: 7.5 mm Number of attempts: 1 Airway Equipment and Method: Stylet Placement Confirmation: ETT inserted through vocal cords under direct vision,  positive ETCO2 and breath sounds checked- equal and bilateral Secured at:  23 cm Tube secured with: Tape Dental Injury: Teeth and Oropharynx as per pre-operative assessment

## 2015-05-31 NOTE — OR Nursing (Signed)
Dr Ether GriffinsFowler in to see patient clarified consent. Consent signed by patient.

## 2015-05-31 NOTE — Transfer of Care (Signed)
Immediate Anesthesia Transfer of Care Note  Patient: Andre OliphantDavy Christensen  Procedure(s) Performed: Procedure(s): OPEN REDUCTION INTERNAL FIXATION (ORIF) CALCANEOUS FRACTURE (Left)  Patient Location: PACU  Anesthesia Type:General  Level of Consciousness: awake, alert  and oriented  Airway & Oxygen Therapy: Patient Spontanous Breathing and Patient connected to face mask oxygen  Post-op Assessment: Report given to RN and Post -op Vital signs reviewed and stable  Post vital signs: Reviewed and stable  Last Vitals:  Filed Vitals:   05/31/15 0840 05/31/15 0843  BP: 130/92   Pulse: 74 71  Temp:    Resp: 12 15    Complications: No apparent anesthesia complications

## 2015-05-31 NOTE — Op Note (Signed)
Operative note   Surgeon:Aylla Huffine Armed forces logistics/support/administrative officerowler    Assistant: None    Preop diagnosis: Tongue type calcaneal fracture left lower extremity    Postop diagnosis: Same    Procedure: Open reduction with internal fixation tongue type left calcaneal fracture. 2x 5.0 mm cannulated Biomet screws    EBL: Minimal    Anesthesia:regional and general    Hemostasis: Thigh tourniquet inflated to 250 mmHg for approximate 65 minutes    Specimen: Small bone fragment from calcaneal fracture    Complications: None    Operative indications:Primo Andres ShadCrockett is an 55 y.o. that presents today for surgical intervention.  The risks/benefits/alternatives/complications have been discussed and consent has been given.    Procedure:  Patient was brought into the OR and placed on the operating table in the prone position. After anesthesia was obtained theleft lower extremity was prepped and draped in usual sterile fashion.  Attention was directed to the posterior aspect of the calcaneus where after inflation of the tourniquet a longitudinal incision was made just lateral to midline of the Achilles site at its insertion. Sharp and blunt dissection was carried down through the skin into the dermal layer and subcutaneous tissue. At this time there was noted to be the large calcaneal tongue-type fracture with the Achilles intact along the insertion of the fracture fragment. The fracture fragments was noted to be displaced dorsally. I was able to remove debris from the fracture fragment at this time. Next using a large bone reduction clamp the fracture fragment was reduced into a good anatomically aligned position. No prominence of the posterior calcaneus was noted after reduction. This was noted on multiple views of fluoroscopy. At this time to 5.0 mm Biomet cannulated cortical screws were placed across the fracture fragment with normal technique. Good stability was noted after compression. Second screw was placed through the Achilles  tendon at the posterior superior aspect and a small tendon splitting incision was made. This was buried into the calcaneus and not prominent to the Achilles tendon itself. The foot was put through range of motion with live fluoroscopy and no signs of motion or displacement were noted. Clinically there was no motion noted with range of motion. At this time the wound was then flushed with copious amounts of irrigation. Closure was performed with a 3-0 Vicryl the deeper layer, 4-0 Vicryl for the subcutaneous tissue and 5-0 Monocryl undyed for the skin. 0.25% Marcaine was placed around all areas. The patient had been given a popliteal block. Patient was placed in a well compressive bulky sterile dressing with padding. The foot was held in gravity plantar flexion and a posterior splint was applied.    Patient tolerated the procedure and anesthesia well.  Was transported from the OR to the PACU with all vital signs stable and vascular status intact. To be discharged per routine protocol.  Will follow up in approximately 1 week in the outpatient clinic.

## 2015-05-31 NOTE — H&P (Signed)
  HISTORY AND PHYSICAL INTERVAL NOTE:  05/31/2015  8:20 AM  Andre Christensen  has presented today for surgery, with the diagnosis of 592.032a.  The various methods of treatment have been discussed with the patient.  No guarantees were given.  After consideration of risks, benefits and other options for treatment, the patient has consented to surgery.  I have reviewed the patients' chart and labs.    Patient Vitals for the past 24 hrs:  BP Temp Temp src Pulse Resp SpO2 Height Weight  05/31/15 0733 (!) 136/93 mmHg - - 82 16 98 % - -  05/31/15 0730 - 97.4 F (36.3 C) Tympanic - - - 6\' 2"  (1.88 m) 117.482 kg (259 lb)  05/30/15 1556 - - - - - - 6\' 2"  (1.88 m) 117.482 kg (259 lb)    A history and physical examination was performed in my office.  The patient was reexamined.  There have been no changes to this history and physical examination.  ORIF left calcaneal fracture.  Gwyneth RevelsFowler, Kaziah Krizek A

## 2015-05-31 NOTE — Anesthesia Postprocedure Evaluation (Signed)
Anesthesia Post Note  Patient: Andre OliphantDavy Christensen  Procedure(s) Performed: Procedure(s) (LRB): OPEN REDUCTION INTERNAL FIXATION (ORIF) CALCANEOUS FRACTURE (Left)  Patient location during evaluation: PACU Anesthesia Type: General Level of consciousness: awake and alert Pain management: pain level controlled Vital Signs Assessment: post-procedure vital signs reviewed and stable Respiratory status: spontaneous breathing, nonlabored ventilation, respiratory function stable and patient connected to nasal cannula oxygen Cardiovascular status: blood pressure returned to baseline and stable Postop Assessment: no signs of nausea or vomiting Anesthetic complications: no    Last Vitals:  Filed Vitals:   05/31/15 1125 05/31/15 1155  BP: 122/76 126/80  Pulse: 79 77  Temp: 36.1 C   Resp: 16 16    Last Pain:  Filed Vitals:   05/31/15 1158  PainSc: Asleep                 Cleda MccreedyJoseph K Piscitello

## 2015-06-03 ENCOUNTER — Encounter: Payer: Self-pay | Admitting: Podiatry

## 2015-06-03 LAB — SURGICAL PATHOLOGY

## 2015-06-04 ENCOUNTER — Encounter: Payer: Self-pay | Admitting: Podiatry

## 2015-07-24 ENCOUNTER — Other Ambulatory Visit: Payer: Self-pay | Admitting: Podiatry

## 2015-07-24 DIAGNOSIS — S92032D Displaced avulsion fracture of tuberosity of left calcaneus, subsequent encounter for fracture with routine healing: Secondary | ICD-10-CM

## 2015-08-01 ENCOUNTER — Ambulatory Visit
Admission: RE | Admit: 2015-08-01 | Discharge: 2015-08-01 | Disposition: A | Discharge status: Home / Self Care | Payer: BLUE CROSS/BLUE SHIELD | Source: Ambulatory Visit | Attending: Podiatry | Admitting: Podiatry

## 2015-08-01 DIAGNOSIS — S92032D Displaced avulsion fracture of tuberosity of left calcaneus, subsequent encounter for fracture with routine healing: Secondary | ICD-10-CM | POA: Diagnosis present

## 2015-08-01 DIAGNOSIS — X58XXXD Exposure to other specified factors, subsequent encounter: Secondary | ICD-10-CM | POA: Diagnosis not present

## 2015-12-25 ENCOUNTER — Ambulatory Visit: Payer: BLUE CROSS/BLUE SHIELD | Attending: Specialist

## 2015-12-25 DIAGNOSIS — G4733 Obstructive sleep apnea (adult) (pediatric): Secondary | ICD-10-CM | POA: Diagnosis not present

## 2015-12-25 DIAGNOSIS — G4761 Periodic limb movement disorder: Secondary | ICD-10-CM | POA: Diagnosis not present

## 2015-12-25 DIAGNOSIS — I10 Essential (primary) hypertension: Secondary | ICD-10-CM | POA: Diagnosis not present

## 2016-12-05 IMAGING — CR DG CHEST 2V
1 series · 2 of 2 positions shown · non-contrast
Comparison: None.

CLINICAL DATA: Patient with chest tightness.  Dry mouth.

EXAM:
CHEST  2 VIEW

[Series 1: w chest pa · 0.14mm/px · 2 of 2 slices shown]
[im 1/2]
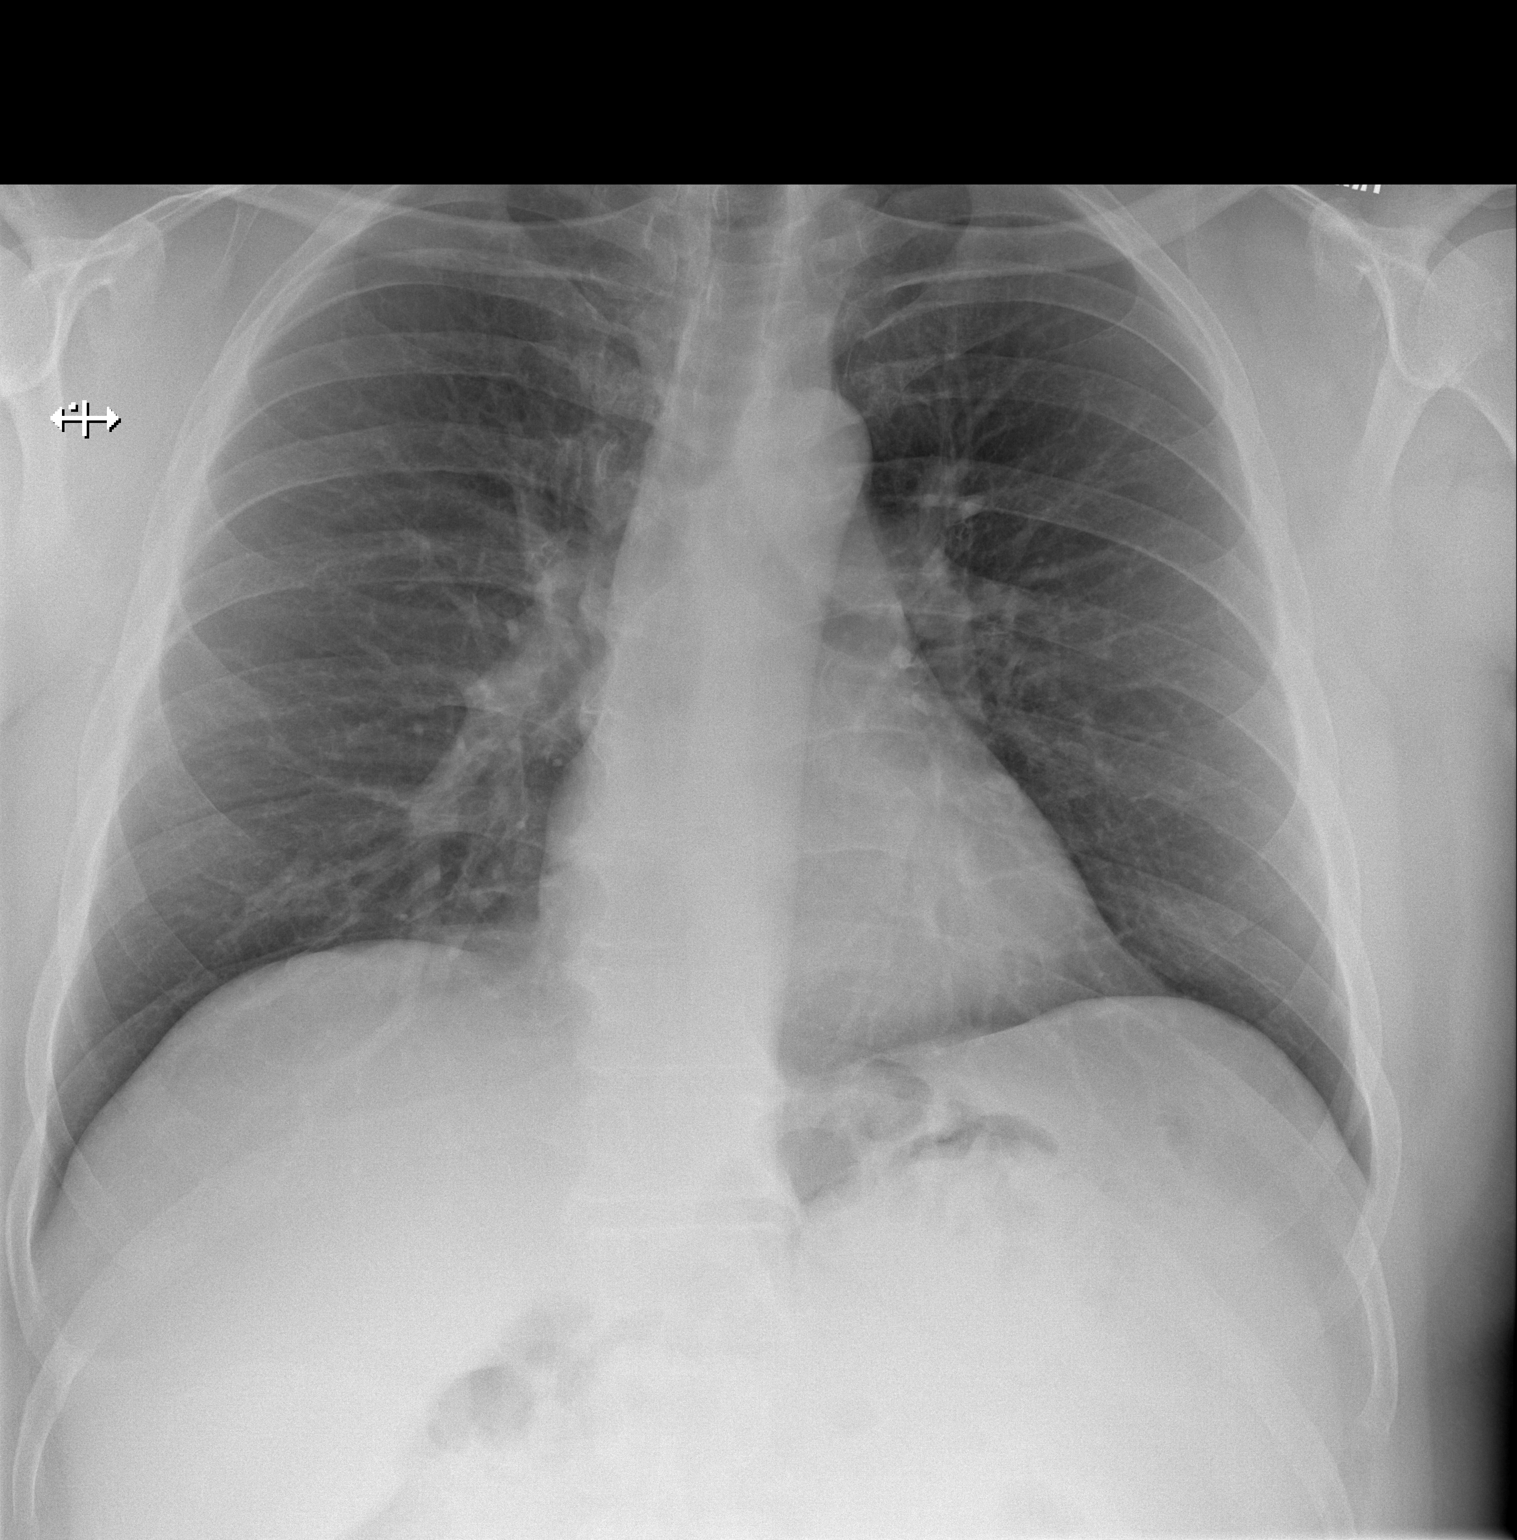
[im 2/2]
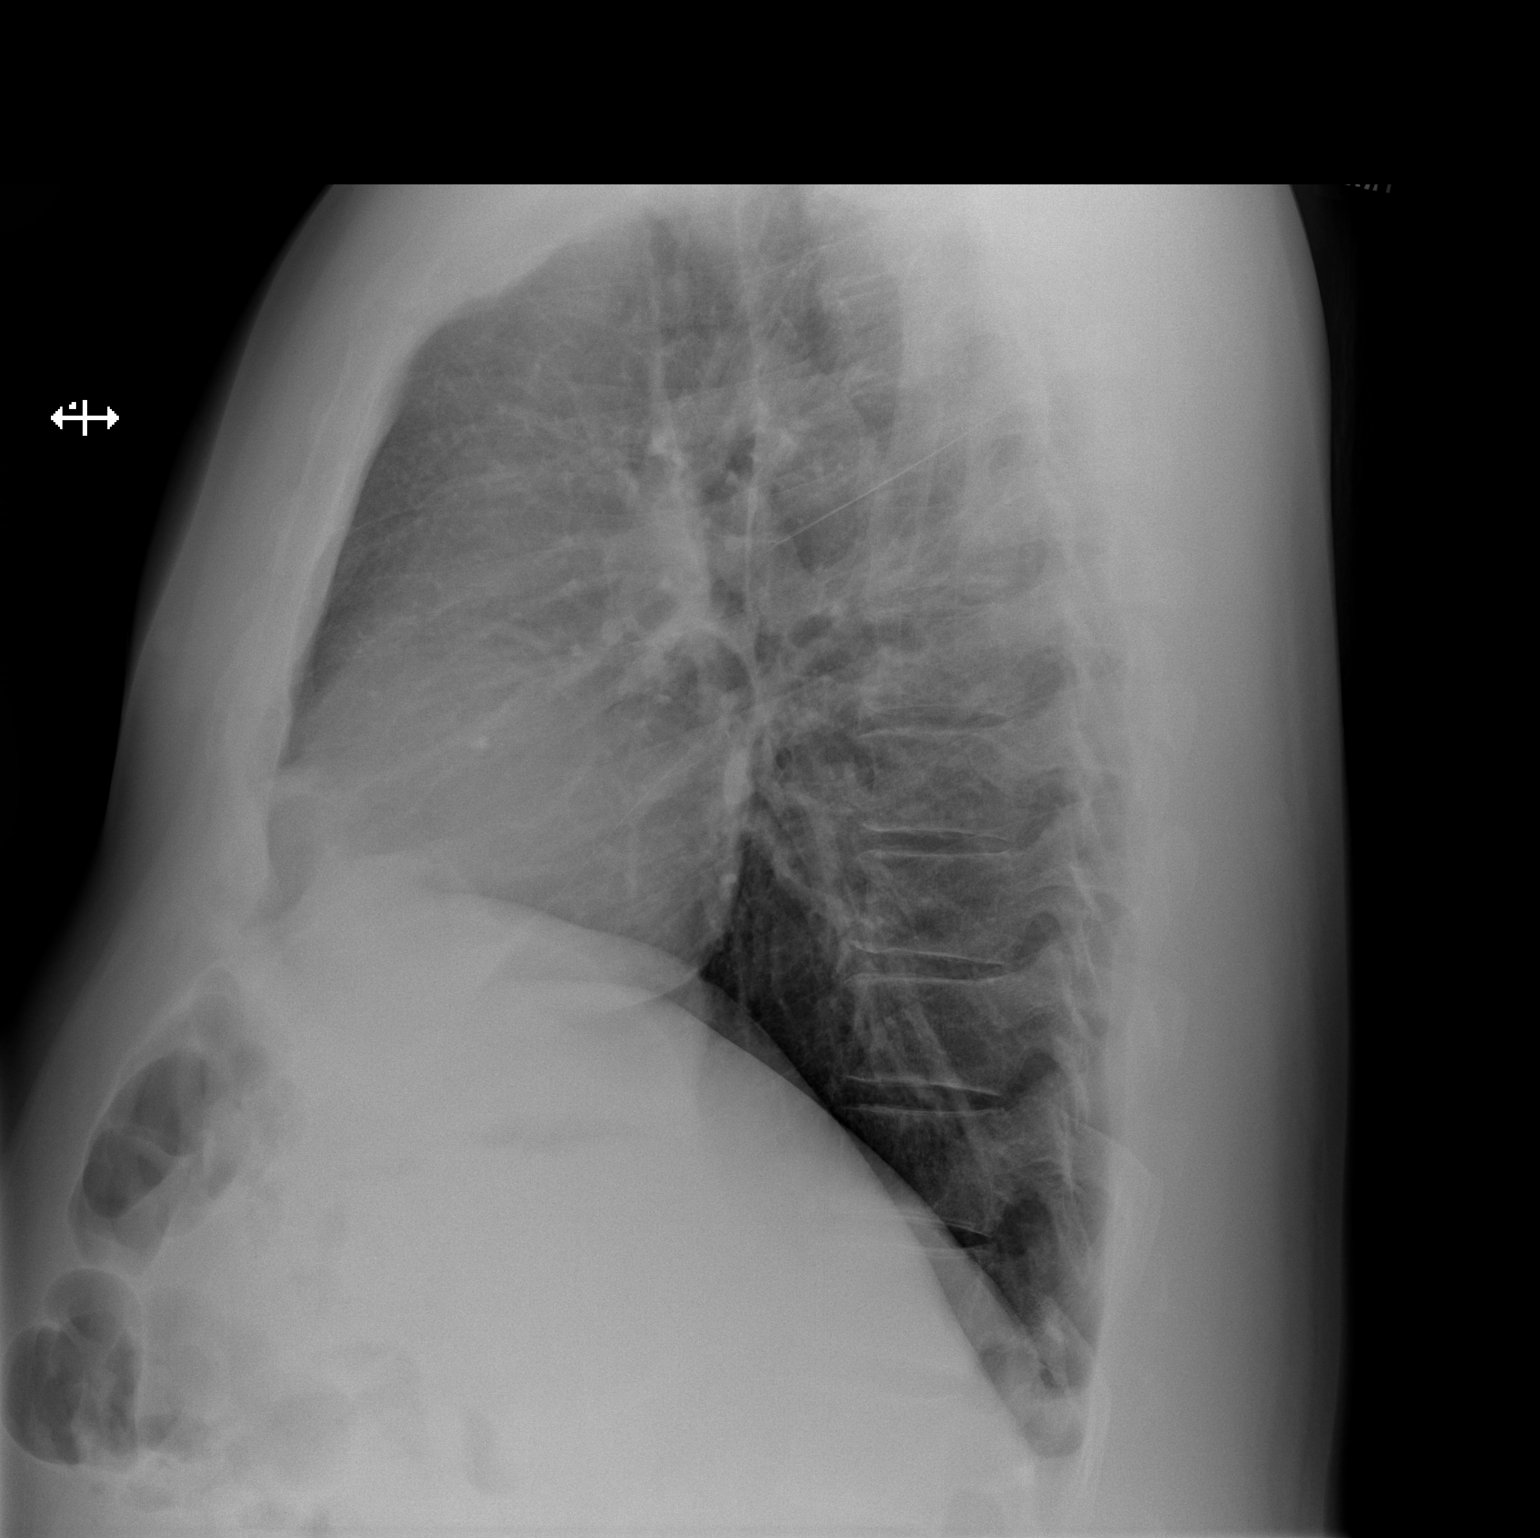

[2 of 2 positions shown; findings below may reference images not displayed]

FINDINGS: Normal cardiac and mediastinal contours. No consolidative pulmonary
opacities. No pleural effusion or pneumothorax. Regional skeleton is
unremarkable.
IMPRESSION: No active cardiopulmonary disease.

## 2017-04-30 IMAGING — CT CT ANKLE*L* W/O CM
1 series · 7 of 14 positions shown, 9 images · non-contrast
Comparison: None available. Report only from outside radiographs
05/26/2015.

CLINICAL DATA: Calcaneal fracture sustained 3 months ago status
post ORIF. Ankle pain with weight-bearing. Evaluate healing.

EXAM:
CT OF THE LEFT ANKLE WITHOUT CONTRAST
TECHNIQUE: Multidetector CT imaging of the left ankle was performed according
to the standard protocol. Multiplanar CT image reconstructions were
also generated.

[Series 6: axial st · axial · 0.23mm/px · z∈[+12,+146]mm · 7 of 169 slices shown, 9 images]
[im 13/169  soft-tissue]
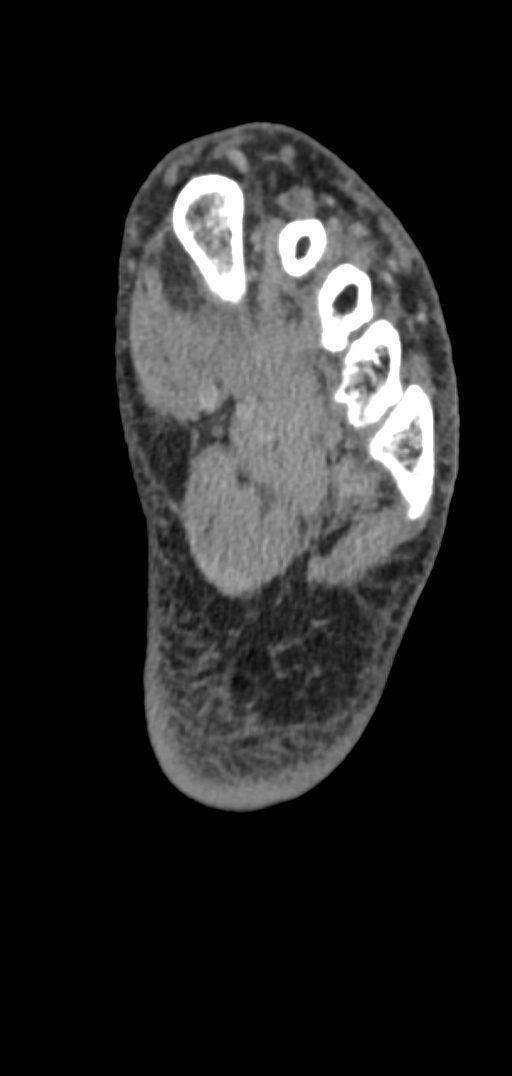
[im 13/169  bone]
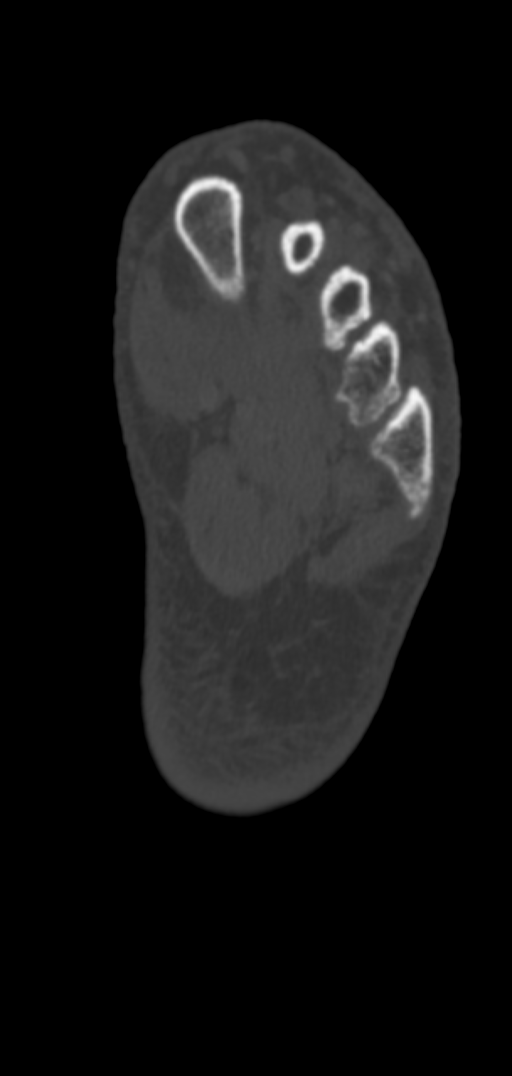
[im 39/169  bone]
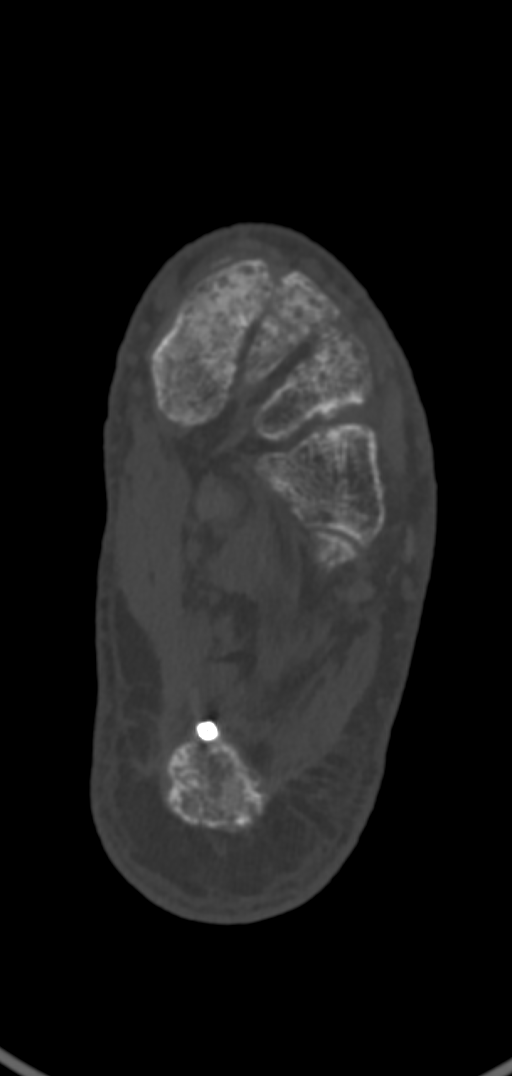
[im 65/169  bone]
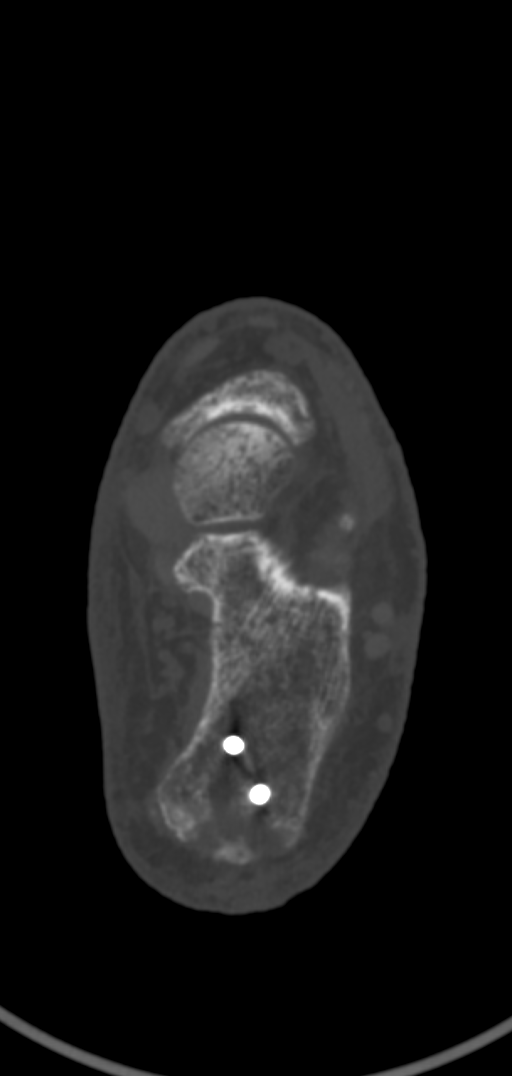
[im 91/169  bone]
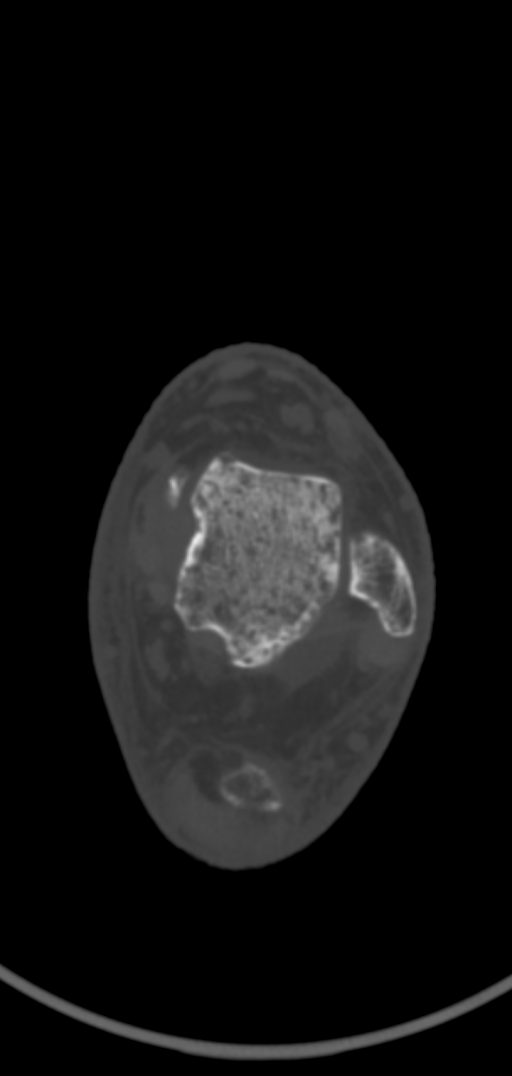
[im 104/169  soft-tissue]
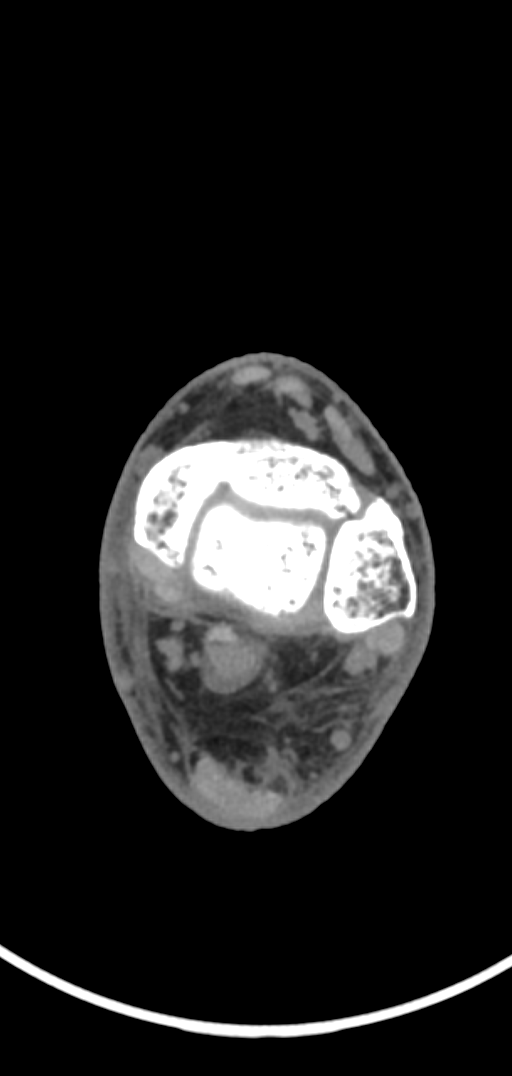
[im 104/169  bone]
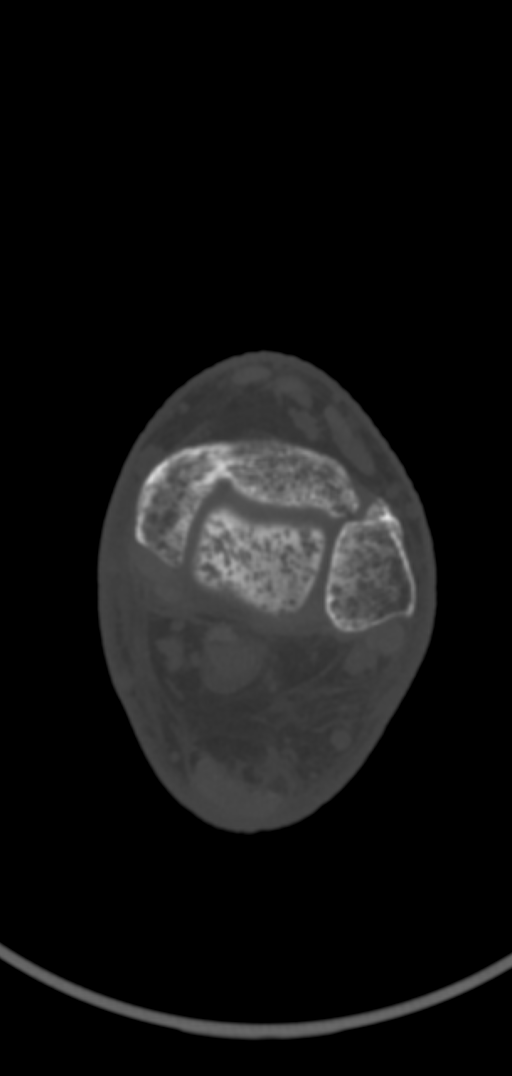
[im 130/169  bone]
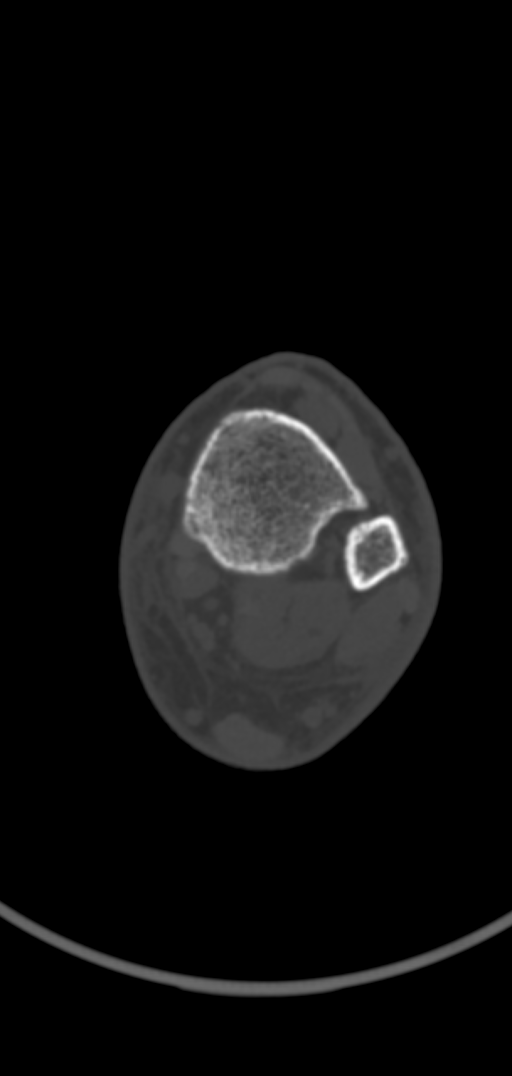
[im 156/169  bone]
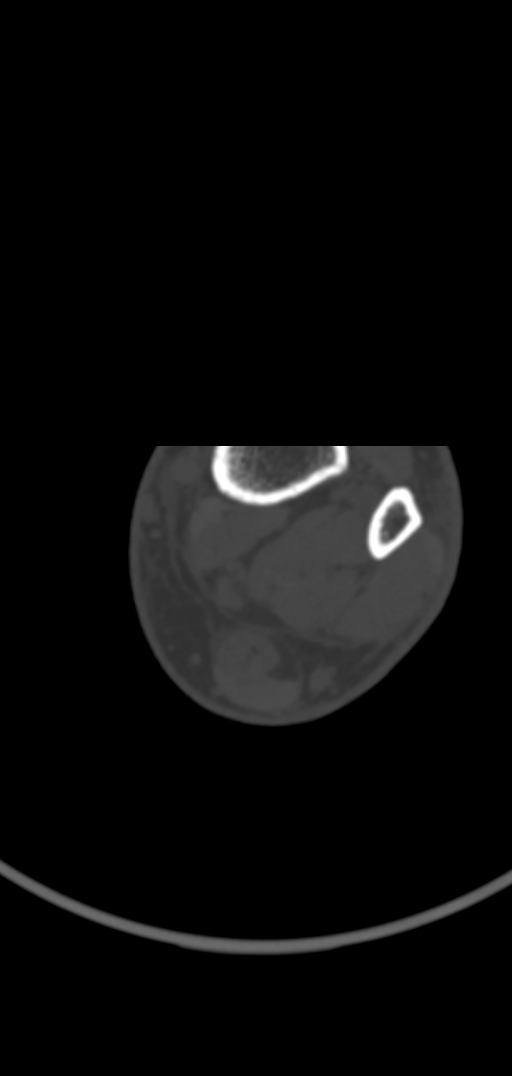

[7 of 14 positions shown; findings below may reference images not displayed]

FINDINGS: Bones: Status post fixation of a fracture of the calcaneal
tuberosity using 2 cannulated screws. There is no hardware loosening
or displacement. The fracture is situated in an oblique axial plane.
There is a persistent fracture line posteriorly in the tuberosity,
displaced by up to 5 mm on coronal image 20. Anteriorly, the
fracture line is not well seen and appears at least partially
healed. No obvious extension into the subtalar joint. The bones are
severely demineralized. No other fractures are seen.

Joint/cartilage: The subtalar, calcaneocuboid and tibiotalar joints
appear unremarkable. No significant joint effusions.

Ligaments: Not applicable for exam/indication. The anterior
talofibular ligament does appear thickened.

Tendons/muscles: There is mild thickening of the distal Achilles
tendon adjacent to the surgical hardware. As evaluated by CT, the
additional ankle tendons appear unremarkable.

Neurovascular/other soft tissues: There is dermal and subcutaneous
edema surrounding the ankle. No focal fluid collections are evident.
IMPRESSION: 1. Suspected partial healing of insufficiency fracture involving the
superior aspect of the calcaneal tuberosity status post ORIF. The
fracture is incompletely healed and mildly displaced posteriorly. No
hardware loosening or displacement seen.
2. No obvious intra-articular extension of the fracture.
3. Severe osseous demineralization, likely due to nonweightbearing,
although potentially secondary to complex regional pain syndrome
given associated subcutaneous edema.

## 2020-04-23 ENCOUNTER — Encounter: Payer: Self-pay | Admitting: *Deleted

## 2020-04-23 ENCOUNTER — Other Ambulatory Visit: Payer: Self-pay

## 2020-04-23 ENCOUNTER — Encounter: Payer: BC Managed Care – PPO | Attending: Infectious Diseases | Admitting: *Deleted

## 2020-04-23 VITALS — BP 130/90 | Ht 74.0 in | Wt 249.3 lb

## 2020-04-23 DIAGNOSIS — I10 Essential (primary) hypertension: Secondary | ICD-10-CM | POA: Diagnosis not present

## 2020-04-23 DIAGNOSIS — E119 Type 2 diabetes mellitus without complications: Secondary | ICD-10-CM | POA: Diagnosis present

## 2020-04-23 DIAGNOSIS — E1165 Type 2 diabetes mellitus with hyperglycemia: Secondary | ICD-10-CM

## 2020-04-23 NOTE — Patient Instructions (Signed)
Check blood sugars 1 x day before breakfast or 2 hrs after one meal every day Bring blood sugar records to the next class  Exercise: Continue walking for  30-45  minutes 5  days a week  Eat 3 meals day,  1-2  snacks a day Space meals 4-6 hours apart  Make an eye doctor appointment  Return for classes on:

## 2020-04-24 ENCOUNTER — Encounter: Payer: Self-pay | Admitting: *Deleted

## 2020-04-24 NOTE — Progress Notes (Signed)
Diabetes Self-Management Education  Visit Type: First/Initial  Appt. Start Time: 1535 Appt. End Time: 1640  04/23/2020  Mr. Andre Christensen, identified by name and date of birth, is a 60 y.o. male with a diagnosis of Diabetes: Type 2.   ASSESSMENT  Height 6\' 2"  (1.88 m), weight 249 lb 4.8 oz (113.1 kg). Body mass index is 32.01 kg/m.   Diabetes Self-Management Education - 04/23/20 1700      Visit Information   Visit Type First/Initial      Initial Visit   Diabetes Type Type 2    Are you currently following a meal plan? Yes    What type of meal plan do you follow? "kind of" - no biscuits, no white bread, nothing white    Are you taking your medications as prescribed? Not on Medications    Date Diagnosed 2 weeks ago      Health Coping   How would you rate your overall health? Good      Psychosocial Assessment   Patient Belief/Attitude about Diabetes Motivated to manage diabetes    Self-care barriers None    Self-management support Doctor's office;Family    Other persons present Spouse/SO    Patient Concerns Nutrition/Meal planning;Glycemic Control;Weight Control    Special Needs None    Preferred Learning Style Hands on    Learning Readiness Change in progress    How often do you need to have someone help you when you read instructions, pamphlets, or other written materials from your doctor or pharmacy? 1 - Never    What is the last grade level you completed in school? BS      Pre-Education Assessment   Patient understands the diabetes disease and treatment process. Needs Review    Patient understands incorporating nutritional management into lifestyle. Needs Instruction    Patient undertands incorporating physical activity into lifestyle. Needs Review    Patient understands using medications safely. Needs Instruction    Patient understands monitoring blood glucose, interpreting and using results Needs Review    Patient understands prevention, detection, and treatment of  acute complications. Needs Instruction    Patient understands prevention, detection, and treatment of chronic complications. Needs Instruction    Patient understands how to develop strategies to address psychosocial issues. Needs Instruction    Patient understands how to develop strategies to promote health/change behavior. Needs Instruction      Complications   Last HgB A1C per patient/outside source 10.9 %   04/10/2020   How often do you check your blood sugar? 1-2 times/day    Fasting Blood glucose range (mg/dL) 06/08/2020   Pt reports FBG's 160's mg/dL   Have you had a dilated eye exam in the past 12 months? No    Have you had a dental exam in the past 12 months? Yes    Are you checking your feet? No      Dietary Intake   Breakfast quiche or boiled eggs    Lunch left obers from supper or sub with whole wheat including chicken and veggies    Snack (afternoon) apple, cottage cheese, peanut butter and celery, nuts, edamane    Dinner chicken, pork, fish, salmon, occasional beef; sweet potatoes, beans, corn, pasta, green beans, broccoli, squash, zucchini, peppers, lettuce, tomato, cuccumbers, onions    Beverage(s) wataer, coffee      Exercise   Exercise Type Light (walking / raking leaves)    How many days per week to you exercise? 5    How many minutes per  day do you exercise? 37    Total minutes per week of exercise 185      Patient Education   Previous Diabetes Education No    Disease state  Definition of diabetes, type 1 and 2, and the diagnosis of diabetes;Factors that contribute to the development of diabetes    Nutrition management  Role of diet in the treatment of diabetes and the relationship between the three main macronutrients and blood glucose level;Food label reading, portion sizes and measuring food.;Reviewed blood glucose goals for pre and post meals and how to evaluate the patients' food intake on their blood glucose level.    Physical activity and exercise  Role of exercise  on diabetes management, blood pressure control and cardiac health.    Monitoring Purpose and frequency of SMBG.;Taught/discussed recording of test results and interpretation of SMBG.;Identified appropriate SMBG and/or A1C goals.    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 Expect positive outcomes           Individualized Plan for Diabetes Self-Management Training:   Learning Objective:  Patient will have a greater understanding of diabetes self-management. Patient education plan is to attend individual and/or group sessions per assessed needs and concerns.   Plan:   Patient Instructions  Check blood sugars 1 x day before breakfast or 2 hrs after one meal every day Bring blood sugar records to the next class  Exercise: Continue walking for  30-45  minutes 5  days a week  Eat 3 meals day,  1-2  snacks a day Space meals 4-6 hours apart  Make an eye doctor appointment  Expected Outcomes:  Demonstrated interest in learning. Expect positive outcomes  Education material provided:  General Meal Planning Guidelines Simple Meal Plan  If problems or questions, patient to contact team via:  Sharion Settler, RN, CCM, CDCES [phone]  Future DSME appointment:  April 29, 2020 for Diabetes Class 1

## 2020-04-29 ENCOUNTER — Encounter: Payer: BC Managed Care – PPO | Admitting: Dietician

## 2020-04-29 ENCOUNTER — Encounter: Payer: Self-pay | Admitting: Dietician

## 2020-04-29 ENCOUNTER — Other Ambulatory Visit: Payer: Self-pay

## 2020-04-29 VITALS — Ht 74.0 in | Wt 249.3 lb

## 2020-04-29 DIAGNOSIS — E119 Type 2 diabetes mellitus without complications: Secondary | ICD-10-CM | POA: Diagnosis not present

## 2020-04-29 DIAGNOSIS — E1165 Type 2 diabetes mellitus with hyperglycemia: Secondary | ICD-10-CM

## 2020-04-29 NOTE — Progress Notes (Signed)

## 2020-05-06 ENCOUNTER — Other Ambulatory Visit: Payer: Self-pay

## 2020-05-06 ENCOUNTER — Encounter: Payer: BC Managed Care – PPO | Attending: Infectious Diseases | Admitting: *Deleted

## 2020-05-06 ENCOUNTER — Encounter: Payer: Self-pay | Admitting: *Deleted

## 2020-05-06 VITALS — Wt 248.8 lb

## 2020-05-06 DIAGNOSIS — E1165 Type 2 diabetes mellitus with hyperglycemia: Secondary | ICD-10-CM | POA: Insufficient documentation

## 2020-05-06 NOTE — Progress Notes (Signed)

## 2020-05-13 ENCOUNTER — Encounter: Payer: BC Managed Care – PPO | Admitting: Dietician

## 2020-05-13 ENCOUNTER — Encounter: Payer: Self-pay | Admitting: Dietician

## 2020-05-13 ENCOUNTER — Other Ambulatory Visit: Payer: Self-pay

## 2020-05-13 VITALS — BP 138/90 | Ht 74.0 in | Wt 248.9 lb

## 2020-05-13 DIAGNOSIS — E1165 Type 2 diabetes mellitus with hyperglycemia: Secondary | ICD-10-CM

## 2020-05-13 NOTE — Progress Notes (Signed)

## 2020-05-16 ENCOUNTER — Encounter: Payer: Self-pay | Admitting: *Deleted

## 2021-04-03 ENCOUNTER — Other Ambulatory Visit: Payer: Self-pay

## 2021-04-03 ENCOUNTER — Ambulatory Visit
Admission: RE | Admit: 2021-04-03 | Discharge: 2021-04-03 | Disposition: A | Discharge status: Home / Self Care | Payer: BC Managed Care – PPO | Source: Ambulatory Visit | Attending: Infectious Diseases | Admitting: Infectious Diseases

## 2021-04-03 ENCOUNTER — Other Ambulatory Visit: Payer: Self-pay | Admitting: Infectious Diseases

## 2021-04-03 DIAGNOSIS — N50812 Left testicular pain: Secondary | ICD-10-CM | POA: Diagnosis present

## 2022-12-14 ENCOUNTER — Ambulatory Visit: Payer: BC Managed Care – PPO

## 2022-12-14 DIAGNOSIS — K5730 Diverticulosis of large intestine without perforation or abscess without bleeding: Secondary | ICD-10-CM | POA: Diagnosis not present

## 2022-12-14 DIAGNOSIS — Z09 Encounter for follow-up examination after completed treatment for conditions other than malignant neoplasm: Secondary | ICD-10-CM | POA: Diagnosis present

## 2022-12-14 DIAGNOSIS — Z860101 Personal history of adenomatous and serrated colon polyps: Secondary | ICD-10-CM | POA: Diagnosis not present

## 2022-12-14 DIAGNOSIS — D123 Benign neoplasm of transverse colon: Secondary | ICD-10-CM | POA: Diagnosis not present

## 2022-12-14 DIAGNOSIS — K640 First degree hemorrhoids: Secondary | ICD-10-CM | POA: Diagnosis not present

## 2023-04-14 ENCOUNTER — Other Ambulatory Visit: Payer: Self-pay | Admitting: Infectious Diseases

## 2023-04-14 DIAGNOSIS — Z8249 Family history of ischemic heart disease and other diseases of the circulatory system: Secondary | ICD-10-CM

## 2023-04-14 DIAGNOSIS — E66811 Obesity, class 1: Secondary | ICD-10-CM

## 2023-04-14 DIAGNOSIS — R030 Elevated blood-pressure reading, without diagnosis of hypertension: Secondary | ICD-10-CM

## 2023-04-14 DIAGNOSIS — E119 Type 2 diabetes mellitus without complications: Secondary | ICD-10-CM

## 2023-04-21 ENCOUNTER — Ambulatory Visit
Admission: RE | Admit: 2023-04-21 | Discharge: 2023-04-21 | Disposition: A | Discharge status: Home / Self Care | Payer: Self-pay | Source: Ambulatory Visit | Attending: Infectious Diseases | Admitting: Infectious Diseases

## 2023-04-21 DIAGNOSIS — E6609 Other obesity due to excess calories: Secondary | ICD-10-CM | POA: Insufficient documentation

## 2023-04-21 DIAGNOSIS — E66811 Obesity, class 1: Secondary | ICD-10-CM

## 2023-04-21 DIAGNOSIS — Z8249 Family history of ischemic heart disease and other diseases of the circulatory system: Secondary | ICD-10-CM

## 2023-04-21 DIAGNOSIS — Z6831 Body mass index (BMI) 31.0-31.9, adult: Secondary | ICD-10-CM | POA: Insufficient documentation

## 2023-04-21 DIAGNOSIS — R030 Elevated blood-pressure reading, without diagnosis of hypertension: Secondary | ICD-10-CM

## 2023-04-21 DIAGNOSIS — E119 Type 2 diabetes mellitus without complications: Secondary | ICD-10-CM
